# Patient Record
Sex: Female | Born: 1995 | Race: White | Hispanic: No | Marital: Single | Smoking: Never smoker
Health system: Southern US, Community
[De-identification: ages and names within clinical notes are randomized; demographics above are authoritative.]

---
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
## 2016-03-25 ENCOUNTER — Ambulatory Visit
Admission: EM | Admit: 2016-03-25 | Discharge: 2016-03-25 | Disposition: A | Discharge status: Home / Self Care | Payer: Self-pay | Attending: Family Medicine | Admitting: Family Medicine

## 2016-03-25 DIAGNOSIS — M545 Low back pain, unspecified: Secondary | ICD-10-CM

## 2016-03-25 LAB — URINALYSIS, COMPLETE (UACMP) WITH MICROSCOPIC
BACTERIA UA: NONE SEEN
Bilirubin Urine: NEGATIVE
Glucose, UA: NEGATIVE mg/dL
Hgb urine dipstick: NEGATIVE
KETONES UR: NEGATIVE mg/dL
Nitrite: NEGATIVE
PROTEIN: NEGATIVE mg/dL
RBC / HPF: NONE SEEN RBC/hpf (ref 0–5)
Specific Gravity, Urine: 1.02 (ref 1.005–1.030)
pH: 7.5 (ref 5.0–8.0)

## 2016-03-25 LAB — PREGNANCY, URINE: Preg Test, Ur: NEGATIVE

## 2016-03-25 MED ORDER — CYCLOBENZAPRINE HCL 10 MG PO TABS: 10.0000 mg | ORAL_TABLET | Freq: Two times a day (BID) | ORAL | 0 refills | Status: AC | PRN

## 2016-03-25 MED ORDER — MELOXICAM 15 MG PO TABS: 15.0000 mg | ORAL_TABLET | Freq: Every day | ORAL | 0 refills | Status: AC | PRN

## 2016-03-25 NOTE — ED Provider Notes (Signed)
[facility] ____________________________________________  Time seen: Approximately 9:16 PM  I have reviewed the triage vital signs and the nursing notes.   HISTORY  Chief Complaint   Back Pain   HPI Natalie Grant is a 20 y.o. female presenting for the complaints of low back pain. Patient reports back pain present since yesterday. States yesterday evening she was sitting on the couch crosslegged, and states that she was doing this for approximately 30 minutes. Patient states when she went to get up she had onset of pain in her low back. Patient states pain gradually worsened. Patient states that pain is primarily with movement. Reports when lying down still she has no pain. States pain intermittently radiates up her back with bending or twisting. States pain is worse with left twisting. States pain is too low back on both sides, worse in the left. Denies previous back issues.  Denies radiation to legs, paresthesias, urinary or bowel incontinence or retention, paresthesias, recent sickness, dysuria, vaginal complaints, abdominal pain, chest pain, shortness of breath. Reports legs feel fine and continues to ambulate. States did take ibuprofen yesterday which helped. States has not been taking any over-the-counter medications today. 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   Patient's last menstrual period was 03/24/2016.Denies pregnancy.    History reviewed. No pertinent past medical history.  There are no active problems to display for this patient.   History reviewed. No pertinent surgical history.  Current Outpatient Rx  . Order #: [id number] Class: Normal  . Order #: [id number] Class: Normal    No current facility-administered medications for this encounter.   Current Outpatient Prescriptions:  .  cyclobenzaprine (FLEXERIL) 10 MG tablet, Take 1 tablet (10 mg total) by mouth 2 (two) times  daily as needed for muscle spasms. Do not drive while taking as can cause drowsiness., Disp: 14 tablet, Rfl: 0 .  meloxicam (MOBIC) 15 MG tablet, Take 1 tablet (15 mg total) by mouth daily as needed for pain., Disp: 10 tablet, Rfl: 0  Allergies Shrimp [shellfish allergy]  History reviewed. No pertinent family history.  Social History Social History  Substance Use Topics  . Smoking status: Never Smoker  . Smokeless tobacco: Never Used  . Alcohol use No    Review of Systems Constitutional: No fever/chills Eyes: No visual changes. ENT: No sore throat. Cardiovascular: Denies chest pain. Respiratory: Denies shortness of breath. Gastrointestinal: No abdominal pain.  No nausea, no vomiting.  No diarrhea.  No constipation. Genitourinary: Negative for dysuria. Musculoskeletal: Positive for back pain. Skin: Negative for rash. Neurological: Negative for headaches, focal weakness or numbness.  10-point ROS otherwise negative.  ____________________________________________   PHYSICAL EXAM:  VITAL SIGNS: ED Triage Vitals  Enc Vitals Group     BP 03/25/16 1627 117/79     Pulse Rate 03/25/16 1627 88     Resp 03/25/16 1627 16     Temp 03/25/16 1627 98.4 F (36.9 C)     Temp Source 03/25/16 1627 Oral     SpO2 03/25/16 1627 100 %     Weight 03/25/16 1625 215 lb (97.5 kg)     Height 03/25/16 1625 5\' 5"  (1.651 m)     Head Circumference --      Peak Flow --      Pain Score 03/25/16 1627 6     Pain Loc --      Pain Edu? --  Excl. in GC? --     Constitutional: Alert and oriented. Well appearing and in no acute distress. Eyes: Conjunctivae are normal. PERRL. EOMI. ENT      Head: Normocephalic and atraumatic.      Mouth/Throat: Mucous membranes are moist. Neck: No stridor. Supple without meningismus.  Cardiovascular: Normal rate, regular rhythm. Grossly normal heart sounds.  Good peripheral circulation. Respiratory: Normal respiratory effort without tachypnea nor retractions.  Breath sounds are clear and equal bilaterally. No wheezes/rales/rhonchi.. Gastrointestinal: Soft and nontender. No distention. No CVA tenderness. Musculoskeletal:  Nontender with normal range of motion in all extremities. No midline cervical or thoracic tenderness to palpation. Bilateral pedal pulses equal and easily palpated. Steady gait. Changes positions quickly.  Except: patient with diffuse mid lumbar tenderness, worse on left, minimal tenderness to direct palpation, pain increases with left and right rotation and lumbar flexion, full range of motion present. No swelling, no ecchymosis, no rash. No saddle anesthesia. Bilateral plantarflexion and dorsiflexion strong and equal.  Neurologic:  Normal speech and language. Speech is normal. No gait instability.  Skin:  Skin is warm, dry and intact. No rash noted. Psychiatric: Mood and affect are normal. Speech and behavior are normal. Patient exhibits appropriate insight and judgment   ___________________________________________   LABS (all labs ordered are listed, but only abnormal results are displayed)  Labs Reviewed  URINALYSIS, COMPLETE (UACMP) WITH MICROSCOPIC - Abnormal; Notable for the following:       Result Value   APPearance HAZY (*)    Leukocytes, UA TRACE (*)    Squamous Epithelial / LPF 6-30 (*)    All other components within normal limits  URINE CULTURE  PREGNANCY, URINE     PROCEDURES Procedures   INITIAL IMPRESSION / ASSESSMENT AND PLAN / ED COURSE  Pertinent labs & imaging results that were available during my care of the patient were reviewed by me and considered in my medical decision making (see chart for details).   Well-appearing patient. No acute distress. Presents for the complaints of low back pain. Urinalysis reviewed. Suspect contaminated sample, will culture urine. Suspect lumbosacral strain. As nontraumatic and diffuse pain as well as improved pain when lying, will defer x-ray at this time. Patient agrees  this plan. Will treat patient supportively. Will treat patient with oral Mobic and when necessary Flexeril. Encourage rest, fluids, stretching, ice and avoidance of aggravating her chance activity. Work note for today and tomorrow given.Discussed indication, risks and benefits of medications with patient.  Discussed follow up with Primary care physician this week. Discussed follow up and return parameters including no resolution or any worsening concerns. Patient verbalized understanding and agreed to plan.   ____________________________________________   FINAL CLINICAL IMPRESSION(S) / ED DIAGNOSES  Final diagnoses:  Acute low back pain without sciatica, unspecified back pain laterality     Discharge Medication List as of 03/25/2016  5:26 PM    START taking these medications   Details  cyclobenzaprine (FLEXERIL) 10 MG tablet Take 1 tablet (10 mg total) by mouth 2 (two) times daily as needed for muscle spasms. Do not drive while taking as can cause drowsiness., Starting Mon 03/25/2016, Normal    meloxicam (MOBIC) 15 MG tablet Take 1 tablet (15 mg total) by mouth daily as needed for pain., Starting Mon 03/25/2016, Normal        Note: This dictation was prepared with Dragon dictation along with smaller phrase technology. Any transcriptional errors that result from this process are unintentional.    Clinical Course  Renford Dills, NP 03/26/16 1919

## 2016-03-25 NOTE — Discharge Instructions (Signed)
Take medication as prescribed. Rest. Drink plenty of fluids. Apply ice and stretch.   Follow up with your primary care physician or the above this week as needed. Return to Urgent care for new or worsening concerns.

## 2016-03-25 NOTE — ED Triage Notes (Signed)
Patient complains of low back pain. Patient states that back pain started yesterday and pain has been radiating up her back. Patient reports that radiation has stopped but is still having pain.

## 2016-03-27 LAB — URINE CULTURE

## 2016-03-28 ENCOUNTER — Telehealth: Payer: Self-pay

## 2016-03-29 ENCOUNTER — Telehealth: Payer: Self-pay | Admitting: Emergency Medicine

## 2016-03-29 NOTE — Telephone Encounter (Signed)
Patient called stating that she was having dizziness. Patient states that she was seen on Wed. For back pain and was put on Flexeril and Mobic.  Patient states that once she started having dizziness that she stopped both medicines however her dizziness.  Patient states that she could not go to work today because of her dizziness and requests a note for work.  Patient was informed that the Flexeril sometimes can cause dizziness or drowsiness but since her last dose of this medicine was Wed night, we would recommend that she follow-up here or with her PCP for her ongoing dizziness.  Patient verbalized understanding. Patient [facility]

## 2018-07-01 ENCOUNTER — Emergency Department
Admission: EM | Admit: 2018-07-01 | Discharge: 2018-07-01 | Disposition: A | Discharge status: Home / Self Care | Payer: BLUE CROSS/BLUE SHIELD | Attending: Emergency Medicine | Admitting: Emergency Medicine

## 2018-07-01 ENCOUNTER — Encounter: Payer: Self-pay | Admitting: Emergency Medicine

## 2018-07-01 ENCOUNTER — Emergency Department: Payer: BLUE CROSS/BLUE SHIELD

## 2018-07-01 ENCOUNTER — Other Ambulatory Visit: Payer: Self-pay

## 2018-07-01 DIAGNOSIS — R109 Unspecified abdominal pain: Secondary | ICD-10-CM

## 2018-07-01 DIAGNOSIS — R1031 Right lower quadrant pain: Secondary | ICD-10-CM | POA: Diagnosis not present

## 2018-07-01 LAB — URINALYSIS, COMPLETE (UACMP) WITH MICROSCOPIC
Bacteria, UA: NONE SEEN
Bilirubin Urine: NEGATIVE
GLUCOSE, UA: NEGATIVE mg/dL
HGB URINE DIPSTICK: NEGATIVE
Ketones, ur: NEGATIVE mg/dL
Leukocytes,Ua: NEGATIVE
Nitrite: NEGATIVE
PH: 6 (ref 5.0–8.0)
PROTEIN: NEGATIVE mg/dL
SPECIFIC GRAVITY, URINE: 1.009 (ref 1.005–1.030)

## 2018-07-01 LAB — COMPREHENSIVE METABOLIC PANEL
ALBUMIN: 4.6 g/dL (ref 3.5–5.0)
ALT: 17 U/L (ref 0–44)
ANION GAP: 10 (ref 5–15)
AST: 23 U/L (ref 15–41)
Alkaline Phosphatase: 68 U/L (ref 38–126)
BILIRUBIN TOTAL: 0.5 mg/dL (ref 0.3–1.2)
BUN: 16 mg/dL (ref 6–20)
CALCIUM: 9.3 mg/dL (ref 8.9–10.3)
CO2: 23 mmol/L (ref 22–32)
CREATININE: 0.82 mg/dL (ref 0.44–1.00)
Chloride: 103 mmol/L (ref 98–111)
GFR calc Af Amer: >60 mL/min (ref >60)
GFR calc non Af Amer: >60 mL/min (ref >60)
GLUCOSE: 102 mg/dL — AB (ref 70–99)
Potassium: 4 mmol/L (ref 3.5–5.1)
Sodium: 136 mmol/L (ref 135–145)
Total Protein: 8.5 g/dL — ABNORMAL HIGH (ref 6.5–8.1)

## 2018-07-01 LAB — CBC
HCT: 40.3 % (ref 36.0–46.0)
Hemoglobin: 13.2 g/dL (ref 12.0–15.0)
MCH: 29.1 pg (ref 26.0–34.0)
MCHC: 32.8 g/dL (ref 30.0–36.0)
MCV: 89 fL (ref 80.0–100.0)
PLATELETS: 176 10*3/uL (ref 150–400)
RBC: 4.53 MIL/uL (ref 3.87–5.11)
RDW: 13.9 % (ref 11.5–15.5)
WBC: 13 10*3/uL — ABNORMAL HIGH (ref 4.0–10.5)
nRBC: 0 % (ref 0.0–0.2)

## 2018-07-01 LAB — POCT PREGNANCY, URINE: Preg Test, Ur: NEGATIVE

## 2018-07-01 LAB — LIPASE, BLOOD: Lipase: 31 U/L (ref 11–51)

## 2018-07-01 MED ORDER — SODIUM CHLORIDE 0.9% FLUSH: 3.0000 mL | Freq: Once | INTRAVENOUS | Status: DC

## 2018-07-01 NOTE — Discharge Instructions (Addendum)
You may take Tylenol or Motrin for your pain.  Please return to the emergency department if you develop severe pain, lightheadedness or fainting, fever, inability to keep down fluids, or any other symptoms concerning to you.

## 2018-07-01 NOTE — ED Notes (Signed)
Patient transported to CT 

## 2018-07-01 NOTE — ED Triage Notes (Signed)
Pt to ED from home c/o back pain since Saturday worse to right side, and RLQ abd pain, denies n/v/d, denies urinary pain.  States checked urine at urgent care yesterday and results were okay.  Presents ambulatory with steady gait, chest rise even and unlabored, skin warm and dry, in NAD at this time.

## 2018-07-01 NOTE — ED Provider Notes (Signed)
[facility] Emergency Department Provider Note  ____________________________________________  Time seen: Approximately 8:42 PM  I have reviewed the triage vital signs and the nursing notes.   HISTORY  Chief Complaint Back Pain and Abdominal Pain    HPI Natalie Grant is a 23 y.o. female history of obesity presenting for right flank pain.  The patient reports that for the past 5 days, she has had intermittent episodes of right flank pain sometimes radiatingto the midline of the mid-back.  The episodes last for 2 to 3 hours; they sometimes improved with Motrin.  She has not had any change in vaginal discharge, dysuria, nausea or vomiting, diarrhea, fevers or chills.  She was seen at your urgent care yesterday with a reassuring examination and discharge; she was told to present to the emergency department if her pain got worse so she came here today because she had more episodes.  The patient is asymptomatic at this time.  History reviewed. No pertinent past medical history.  There are no active problems to display for this patient.   History reviewed. No pertinent surgical history.  Current Outpatient Rx  . Order #: [id number] Class: Normal  . Order #: [id number] Class: Normal    Allergies Shrimp [shellfish allergy]  History reviewed. No pertinent family history.  Social History Social History   Tobacco Use  . Smoking status: Never Smoker  . Smokeless tobacco: Never Used  Substance Use Topics  . Alcohol use: No  . Drug use: No    Review of Systems Constitutional: No fever/chills.  No lightheadedness or syncope. Eyes: No visual changes. ENT: No sore throat. No congestion or rhinorrhea. Cardiovascular: Denies chest pain. Denies palpitations. Respiratory: Denies shortness of breath.  No cough. Gastrointestinal: No abdominal pain.  No nausea, no vomiting.  No diarrhea.  No constipation.  Positive right flank pain. Genitourinary: Negative for  dysuria. Musculoskeletal: Negative for back pain. Skin: Negative for rash. Neurological: Negative for headaches. No focal numbness, tingling or weakness.     ____________________________________________   PHYSICAL EXAM:  VITAL SIGNS: ED Triage Vitals [07/01/18 1838]  Enc Vitals Group     BP (!) 146/93     Pulse Rate 96     Resp 16     Temp 98.4 F (36.9 C)     Temp Source Oral     SpO2 100 %     Weight 230 lb (104.3 kg)     Height 5\' 5"  (1.651 m)     Head Circumference      Peak Flow      Pain Score 7     Pain Loc      Pain Edu?      Excl. in GC?     Constitutional: Alert and oriented.  Answers questions appropriately.  Patient is comfortable and able to move about the stretcher without any difficulty. Eyes: Conjunctivae are normal.  EOMI. No scleral icterus. Head: Atraumatic. Nose: No congestion/rhinnorhea. Mouth/Throat: Mucous membranes are moist.  Neck: No stridor.  Supple.   Cardiovascular: Normal rate, regular rhythm. No murmurs, rubs or gallops.  Respiratory: Normal respiratory effort.  No accessory muscle use or retractions. Lungs CTAB.  No wheezes, rales or ronchi. Gastrointestinal: Obese.  No Reproducible tenderness to palpation CVA or the abdomen.  Soft, nontender and nondistended.  No guarding or rebound.  No peritoneal signs. Musculoskeletal: No LE edema. No ttp in the calves or palpable cords.  Negative Homan's sign. Neurologic:  A&Ox3.  Speech is clear.  Face and smile  are symmetric.  EOMI.  Moves all extremities well. Skin:  Skin is warm, dry and intact. No rash noted. Psychiatric: Mood and affect are normal. Speech and behavior are normal.  Normal judgement.  ____________________________________________   LABS (all labs ordered are listed, but only abnormal results are displayed)  Labs Reviewed  COMPREHENSIVE METABOLIC PANEL - Abnormal; Notable for the following components:      Result Value   Glucose, Bld 102 (*)    Total Protein 8.5 (*)    All  other components within normal limits  CBC - Abnormal; Notable for the following components:   WBC 13.0 (*)    All other components within normal limits  URINALYSIS, COMPLETE (UACMP) WITH MICROSCOPIC - Abnormal; Notable for the following components:   Color, Urine STRAW (*)    APPearance HAZY (*)    All other components within normal limits  LIPASE, BLOOD  POC URINE PREG, ED  POCT PREGNANCY, URINE   ____________________________________________  EKG  Not indicated ____________________________________________  RADIOLOGY  No results found.  ____________________________________________   PROCEDURES  Procedure(s) performed: None  Procedures  Critical Care performed: No ____________________________________________   INITIAL IMPRESSION / ASSESSMENT AND PLAN / ED COURSE  Pertinent labs & imaging results that were available during my care of the patient were reviewed by me and considered in my medical decision making (see chart for details).  24 y.o. female with intermittent right flank pain for the past 5 days without any other associated symptoms.  Overall, the patient is hemodynamically stable and afebrile.  Her examination is very reassuring since she has no acute physical exam findings.  Her work-up here is reassuring but given this intermittent flank pain and multiple visits, will get a CT scan to rule out renal colic.  Early appendicitis or ovarian pathology is considered but unlikely.  Plan reevaluation for final disposition.  ____________________________________________  FINAL CLINICAL IMPRESSION(S) / ED DIAGNOSES  Final diagnoses:  None         NEW MEDICATIONS STARTED DURING THIS VISIT:  New Prescriptions   No medications on file      Rockne Menghini, MD 07/01/18 2054

## 2020-04-02 IMAGING — CT CT RENAL STONE PROTOCOL
2 of 4 series · 17 of 46 positions shown, 19 images · non-contrast
Comparison: None.

CLINICAL DATA: 22-year-old female with right flank pain for 4 days,
right lower quadrant abdominal pain.

EXAM:
CT ABDOMEN AND PELVIS WITHOUT CONTRAST
TECHNIQUE: Multidetector CT imaging of the abdomen and pelvis was performed
following the standard protocol without IV contrast.

[Series 2: stone full standard · axial · 0.85mm/px · z∈[-493,-53]mm · 14 of 96 slices shown, 16 images]
[im 4/96  soft-tissue]
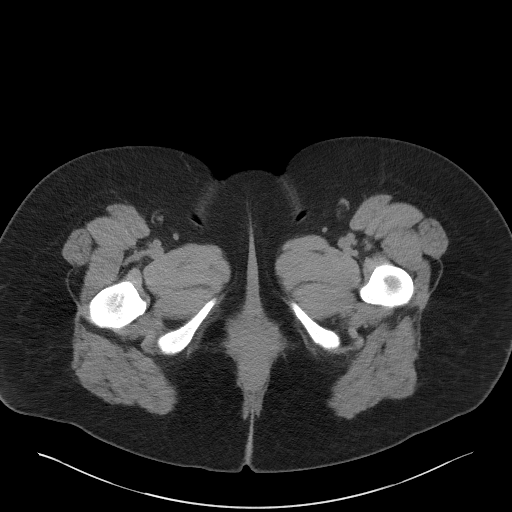
[im 4/96  bone]
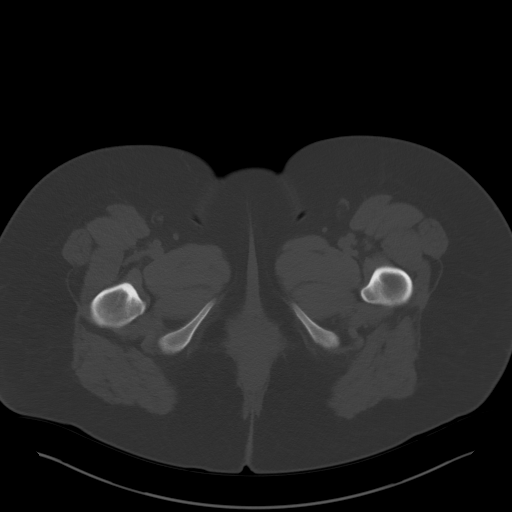
[im 12/96  soft-tissue]
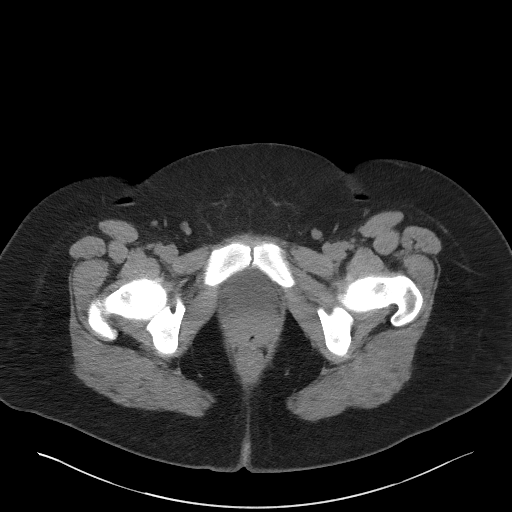
[im 20/96  soft-tissue]
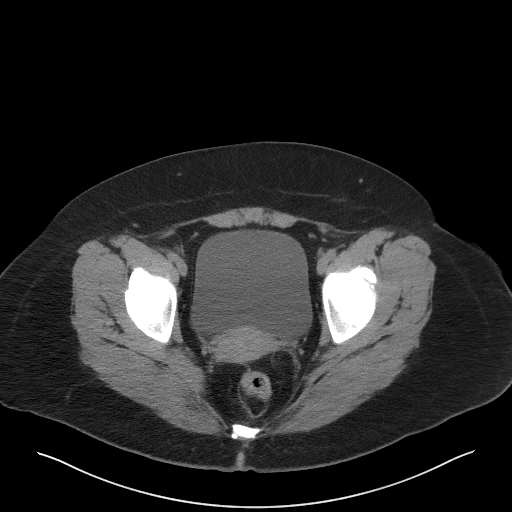
[im 27/96  soft-tissue]
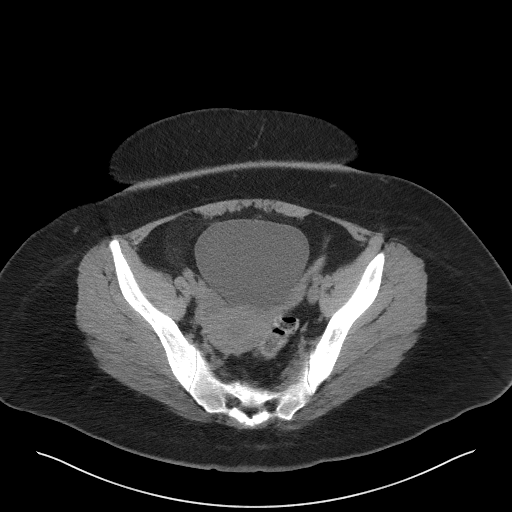
[im 31/96  soft-tissue]
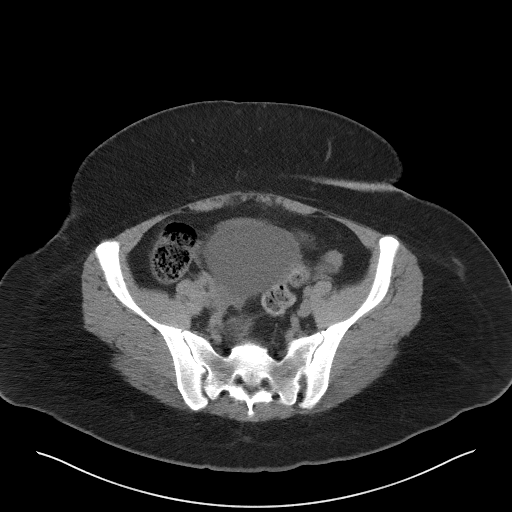
[im 39/96  soft-tissue]
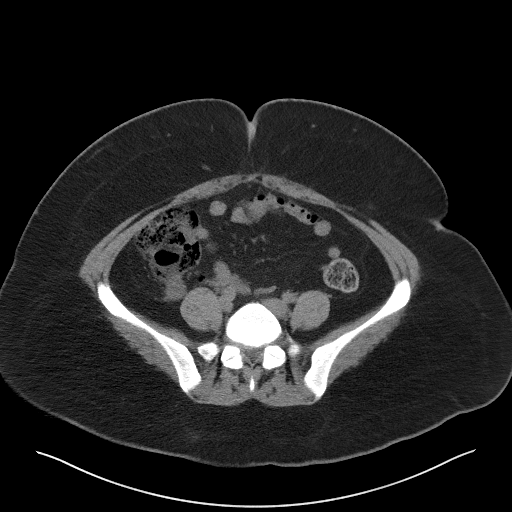
[im 46/96  soft-tissue]
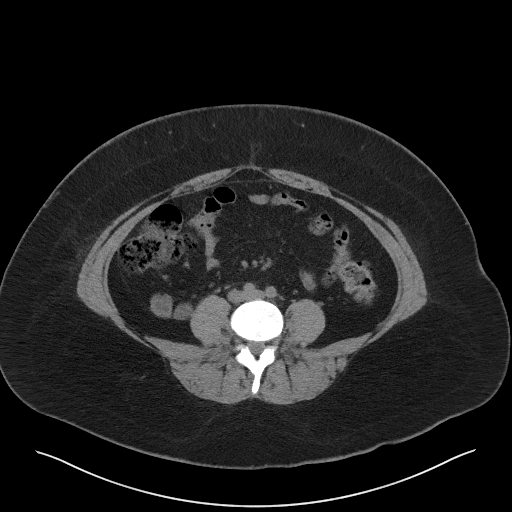
[im 50/96  soft-tissue]
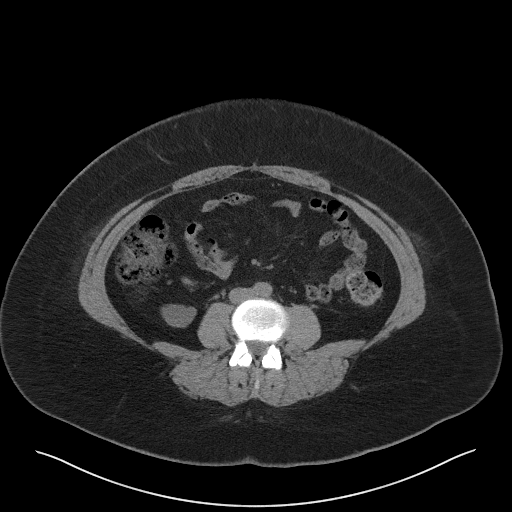
[im 58/96  soft-tissue]
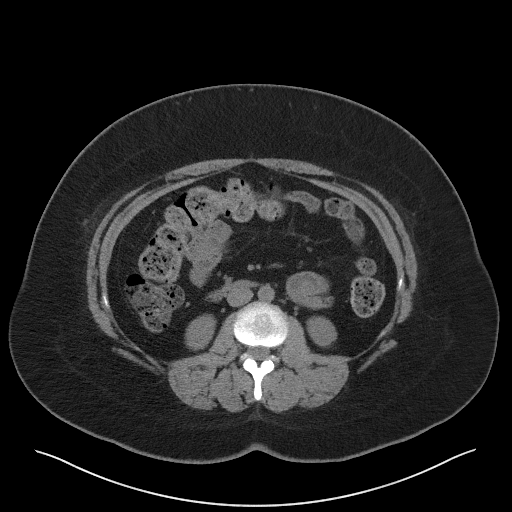
[im 58/96  bone]
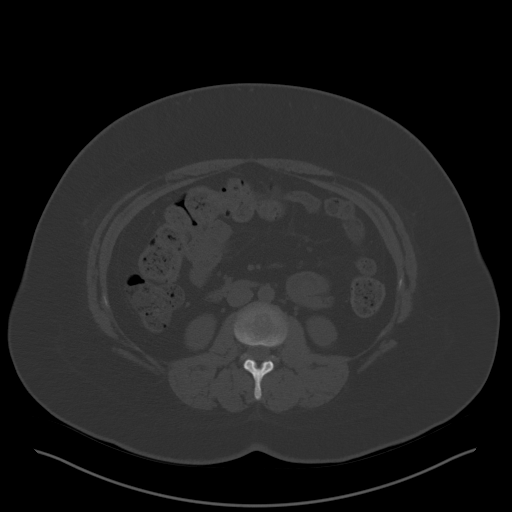
[im 65/96  soft-tissue]
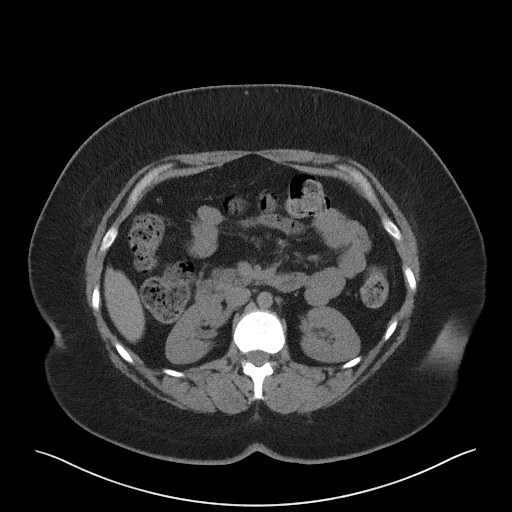
[im 73/96  soft-tissue]
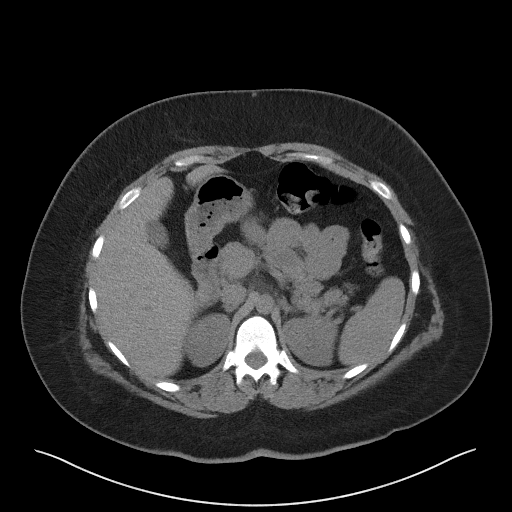
[im 77/96  soft-tissue]
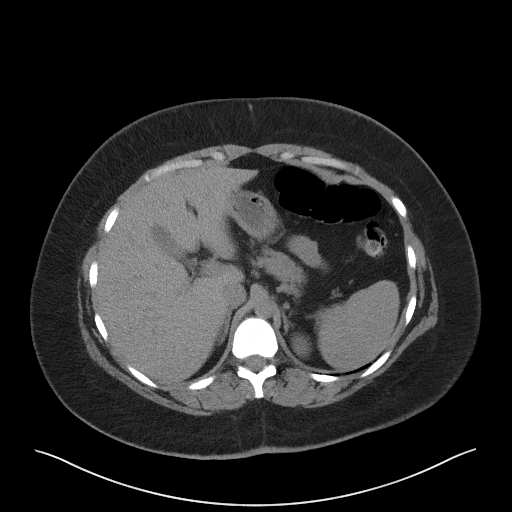
[im 84/96  soft-tissue]
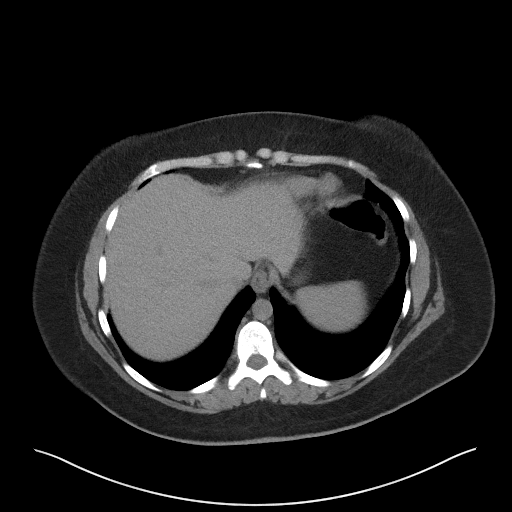
[im 92/96  soft-tissue]
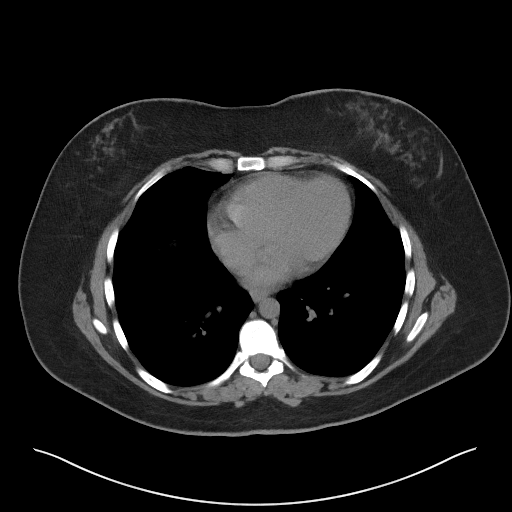

[Series 5: coronal · coronal · 0.92mm/px · 3 of 149 slices shown]
[im 50/149  soft-tissue]
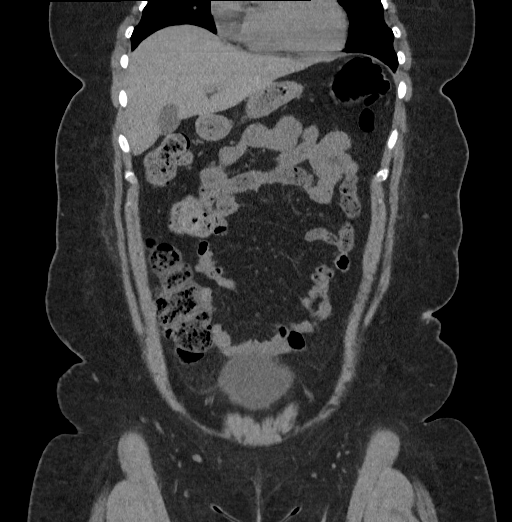
[im 66/149  soft-tissue]
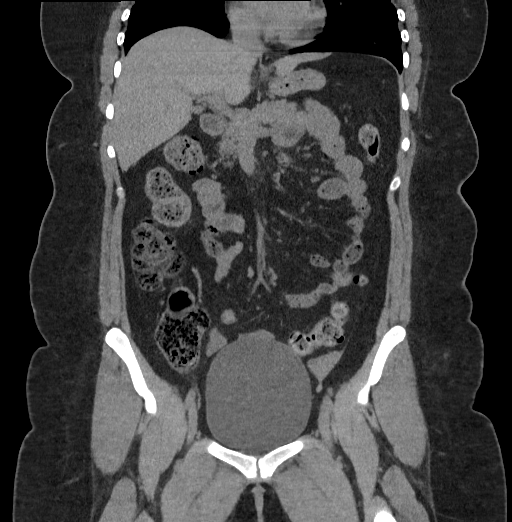
[im 83/149  soft-tissue]
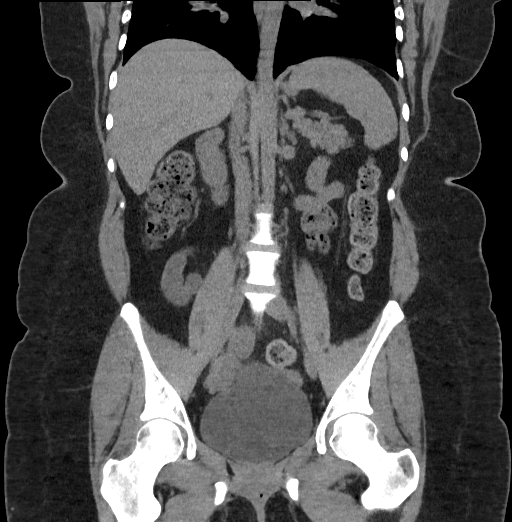

[17 of 46 positions shown; findings below may reference images not displayed]

FINDINGS: Lower chest: Negative. No pericardial or pleural effusion.

Hepatobiliary: Negative noncontrast liver and gallbladder.

Pancreas: Negative.

Spleen: Negative.

Adrenals/Urinary Tract: Normal adrenal glands.

Normal noncontrast left kidney and left ureter. There are several
small left pelvic phleboliths.

The urinary bladder is mildly to moderately distended but otherwise
unremarkable (estimated bladder volume 429 milliliters).

Negative noncontrast right kidney and right ureter.

Stomach/Bowel: Negative large bowel aside from retained stool
throughout. The hepatic flexure is redundant. Appendix is diminutive
or absent (perhaps contracted on sagittal image 73). No pericecal
inflammation. Negative terminal ileum.

Decompressed small bowel and stomach. No free air, free fluid.

Vascular/Lymphatic: Vascular patency is not evaluated in the absence
of IV contrast.

No lymphadenopathy.

Reproductive: Negative.

Other: No pelvic free fluid.

Musculoskeletal: No acute osseous abnormality identified. Small
ununited ossification center or other chronic ossific fragment along
the superior left acetabulum (coronal image 73).
IMPRESSION: Negative noncontrast CT Abdomen and Pelvis.

Distended urinary bladder (estimated volume 429 mL).
